# Patient Record
Sex: Male | Born: 1955 | Race: White | Hispanic: No | Marital: Married | Smoking: Never smoker
Health system: Southern US, Community
[De-identification: ages and names within clinical notes are randomized; demographics above are authoritative.]

## PROBLEM LIST (undated history)

## (undated) DIAGNOSIS — I10 Essential (primary) hypertension: Secondary | ICD-10-CM

## (undated) HISTORY — PX: NECK SURGERY: SHX720

## (undated) HISTORY — PX: HERNIA REPAIR: SHX51

## (undated) HISTORY — PX: TOTAL HIP ARTHROPLASTY: SHX124

---

## 1998-04-14 ENCOUNTER — Ambulatory Visit (HOSPITAL_COMMUNITY): Admission: RE | Admit: 1998-04-14 | Discharge: 1998-04-14 | Payer: Self-pay | Admitting: Neurological Surgery

## 2001-11-05 ENCOUNTER — Encounter: Payer: Self-pay | Admitting: Neurosurgery

## 2001-11-05 ENCOUNTER — Ambulatory Visit (HOSPITAL_COMMUNITY): Admission: RE | Admit: 2001-11-05 | Discharge: 2001-11-05 | Payer: Self-pay | Admitting: Neurosurgery

## 2002-04-24 ENCOUNTER — Encounter: Payer: Self-pay | Admitting: Neurosurgery

## 2002-04-30 ENCOUNTER — Encounter: Payer: Self-pay | Admitting: Neurosurgery

## 2002-04-30 ENCOUNTER — Ambulatory Visit (HOSPITAL_COMMUNITY): Admission: RE | Admit: 2002-04-30 | Discharge: 2002-04-30 | Payer: Self-pay | Admitting: Neurosurgery

## 2002-05-23 ENCOUNTER — Encounter: Payer: Self-pay | Admitting: Neurosurgery

## 2002-05-23 ENCOUNTER — Encounter: Admission: RE | Admit: 2002-05-23 | Discharge: 2002-05-23 | Payer: Self-pay | Admitting: Neurosurgery

## 2002-06-29 ENCOUNTER — Ambulatory Visit (HOSPITAL_COMMUNITY): Admission: RE | Admit: 2002-06-29 | Discharge: 2002-06-29 | Payer: Self-pay | Admitting: Neurosurgery

## 2002-06-29 ENCOUNTER — Encounter: Payer: Self-pay | Admitting: Neurosurgery

## 2003-10-03 ENCOUNTER — Encounter: Admission: RE | Admit: 2003-10-03 | Discharge: 2003-10-03 | Payer: Self-pay | Admitting: Neurosurgery

## 2003-10-22 ENCOUNTER — Ambulatory Visit: Admission: RE | Admit: 2003-10-22 | Discharge: 2003-10-22 | Payer: Self-pay | Admitting: Neurosurgery

## 2004-03-18 ENCOUNTER — Ambulatory Visit (HOSPITAL_COMMUNITY): Admission: RE | Admit: 2004-03-18 | Discharge: 2004-03-19 | Payer: Self-pay | Admitting: Neurosurgery

## 2004-04-05 ENCOUNTER — Encounter: Admission: RE | Admit: 2004-04-05 | Discharge: 2004-04-05 | Payer: Self-pay | Admitting: Neurosurgery

## 2014-04-28 ENCOUNTER — Encounter (HOSPITAL_COMMUNITY): Payer: Self-pay | Admitting: Emergency Medicine

## 2014-04-28 ENCOUNTER — Emergency Department (HOSPITAL_COMMUNITY)
Admission: EM | Admit: 2014-04-28 | Discharge: 2014-04-28 | Disposition: A | Discharge status: Home / Self Care | Payer: BC Managed Care – PPO | Attending: Emergency Medicine | Admitting: Emergency Medicine

## 2014-04-28 DIAGNOSIS — IMO0002 Reserved for concepts with insufficient information to code with codable children: Secondary | ICD-10-CM | POA: Insufficient documentation

## 2014-04-28 DIAGNOSIS — I10 Essential (primary) hypertension: Secondary | ICD-10-CM | POA: Insufficient documentation

## 2014-04-28 DIAGNOSIS — Y9389 Activity, other specified: Secondary | ICD-10-CM | POA: Insufficient documentation

## 2014-04-28 DIAGNOSIS — Y9289 Other specified places as the place of occurrence of the external cause: Secondary | ICD-10-CM | POA: Insufficient documentation

## 2014-04-28 DIAGNOSIS — T18108A Unspecified foreign body in esophagus causing other injury, initial encounter: Secondary | ICD-10-CM | POA: Insufficient documentation

## 2014-04-28 HISTORY — DX: Essential (primary) hypertension: I10

## 2014-04-28 MED ORDER — ONDANSETRON HCL 4 MG/2ML IJ SOLN
4.0000 mg | Freq: Once | INTRAMUSCULAR | Status: AC
  Administered 2014-04-28: 4 mg via INTRAVENOUS
  Filled 2014-04-28: qty 2

## 2014-04-28 MED ORDER — SODIUM CHLORIDE 0.9 % IV SOLN
Freq: Once | INTRAVENOUS | Status: AC
  Administered 2014-04-28: 500 mL via INTRAVENOUS

## 2014-04-28 MED ORDER — NITROGLYCERIN 0.4 MG SL SUBL
0.4000 mg | SUBLINGUAL_TABLET | SUBLINGUAL | Status: AC | PRN
  Administered 2014-04-28 (×3): 0.4 mg via SUBLINGUAL
  Filled 2014-04-28 (×3): qty 1

## 2014-04-28 MED ORDER — SODIUM CHLORIDE 0.9 % IV BOLUS (SEPSIS)
1000.0000 mL | Freq: Once | INTRAVENOUS | Status: AC
  Administered 2014-04-28: 1000 mL via INTRAVENOUS

## 2014-04-28 MED ORDER — MORPHINE SULFATE 4 MG/ML IJ SOLN
4.0000 mg | Freq: Once | INTRAMUSCULAR | Status: AC
  Administered 2014-04-28: 4 mg via INTRAVENOUS
  Filled 2014-04-28: qty 1

## 2014-04-28 MED ORDER — GLUCAGON HCL RDNA (DIAGNOSTIC) 1 MG IJ SOLR
1.0000 mg | Freq: Once | INTRAMUSCULAR | Status: AC
  Administered 2014-04-28: 1 mg via INTRAVENOUS
  Filled 2014-04-28: qty 1

## 2014-04-28 NOTE — ED Notes (Signed)
At some ham at approx 0100, feels food bolus stuck in esophagus.  Unable to swallow own saliva.  Able to burp, cannot swallow.  Has hx esophageal stricture and has esophagus stretched once prior to today.

## 2014-04-28 NOTE — ED Provider Notes (Signed)
CSN: [id number]     Arrival date & time 04/28/14  0233 History   First MD Initiated Contact with Patient 04/28/14 0258     Chief Complaint  Patient presents with  . Foreign Body     (Consider location/radiation/quality/duration/timing/severity/associated sxs/prior Treatment) Patient is a 58 y.o. male presenting with foreign body.  Foreign Body Location:  Swallowed Suspected object: ham. Pain quality:  Pressure Pain severity:  Mild Timing:  Constant Progression:  Unchanged Chronicity:  New Worsened by:  Nothing tried Ineffective treatments:  None tried Associated symptoms: trouble swallowing   Associated symptoms: no difficulty breathing, no drooling and no vomiting   Risk factors: hx of esophageal strictures   Risk factors: no developmental delay     Past Medical History  Diagnosis Date  . Hypertension    Past Surgical History  Procedure Laterality Date  . Hernia repair    . Total hip arthroplasty    . Neck surgery     No family history on file. History  Substance Use Topics  . Smoking status: Never Smoker   . Smokeless tobacco: Not on file  . Alcohol Use: Yes     Comment: ocassional    Review of Systems  HENT: Positive for trouble swallowing. Negative for drooling.   Gastrointestinal: Negative for vomiting.  All other systems reviewed and are negative.     Allergies  Review of patient's allergies indicates not on file.  Home Medications   Prior to Admission medications   Not on File   BP 124/73  Pulse 69  Temp(Src) 98.3 F (36.8 C) (Oral)  Resp 18  Ht 5\' 8"  (1.727 m)  Wt 175 lb (79.379 kg)  BMI 26.61 kg/m2  SpO2 99% Physical Exam  Constitutional: He is oriented to person, place, and time. He appears well-developed and well-nourished. No distress.  HENT:  Head: Normocephalic and atraumatic.  Mouth/Throat: Oropharynx is clear and moist.  Eyes: Conjunctivae are normal. Pupils are equal, round, and reactive to light.  Neck: Normal range of  motion. Neck supple.  Cardiovascular: Normal rate, regular rhythm and intact distal pulses.   Pulmonary/Chest: Effort normal and breath sounds normal. No respiratory distress. He has no wheezes. He has no rales.  Abdominal: Soft. Bowel sounds are normal. There is no tenderness. There is no rebound and no guarding.  Musculoskeletal: Normal range of motion.  Neurological: He is alert and oriented to person, place, and time.  Skin: Skin is warm and dry.  Psychiatric: He has a normal mood and affect.    ED Course  Procedures (including critical care time) Labs Review Labs Reviewed - No data to display  Imaging Review No results found.   EKG Interpretation None      MDM   Final diagnoses:  None    Date: 04/28/2014  Rate: 67  Rhythm: normal sinus rhythm  QRS Axis: normal  Intervals: normal  ST/T Wave abnormalities: normal  Conduction Disutrbances: none  Narrative Interpretation: unremarkable  Medications  sodium chloride 0.9 % bolus 1,000 mL (0 mLs Intravenous Stopped 04/28/14 0410)  ondansetron (ZOFRAN) injection 4 mg (4 mg Intravenous Given 04/28/14 0301)  nitroGLYCERIN (NITROSTAT) SL tablet 0.4 mg (0.4 mg Sublingual Given 04/28/14 0431)  glucagon (human recombinant) (GLUCAGEN) injection 1 mg (1 mg Intravenous Given 04/28/14 0318)  glucagon (human recombinant) (GLUCAGEN) injection 1 mg (1 mg Intravenous Given 04/28/14 0334)  0.9 %  sodium chloride infusion (500 mLs Intravenous New Bag/Given 04/28/14 0431)  morphine 4 MG/ML injection 4 mg (4 mg  Intravenous Given 04/28/14 0524)  ondansetron (ZOFRAN) injection 4 mg (4 mg Intravenous Given 04/28/14 0523)   MDM Reviewed: nursing note and vitals Interpretation: ECG Consults: gastrointestinal   CRITICAL CARE Performed by: Jasmine AwePALUMBO-RASCH,William Laske K Total critical care time: 60 minutes Critical care time was exclusive of separately billable procedures and treating other patients. Critical care was necessary to treat or prevent imminent or  life-threatening deterioration. Critical care was time spent personally by me on the following activities: development of treatment plan with patient and/or surrogate as well as nursing, discussions with consultants, evaluation of patient's response to treatment, examination of patient, obtaining history from patient or surrogate, ordering and performing treatments and interventions, ordering and review of laboratory studies, ordering and review of radiographic studies, pulse oximetry and re-evaluation of patient's condition.   420 case d/w Dr. Russella DarStark on call for Drs Loreta AveMann and Elnoria HowardHung to be endoscoped in am keep NPO  Montrey Buist K Ryanne Morand-Rasch, MD 04/28/14 [phone]

## 2014-04-28 NOTE — ED Notes (Signed)
Reports no change in status.  Bolus continues to block swallowing saliva.

## 2014-04-28 NOTE — Progress Notes (Signed)
Right antecubital iv removed,needlle intact, site within normal limits.Discharge instructions given to patient.Discharged ambulatory.

## 2014-04-28 NOTE — Consult Note (Signed)
**Note Alex-Identified via Obfuscation** Unassigned Consult  Reason for Consult: Food Impaction Referring Physician: ER  Alex Schmidt HPI: This is a 58 year old male with a distant history of esophageal stricture 10 years ago s/p dilation who presents to the ER with complaints of dysphagia.  The patient ate a piece of ham this morning and he started to have an acute episodes of dysphagia.  His symptoms continued to persist and he was not able to tolerate any PO.  On a routine basis he does have some issues with GERD and he states that he has a history of a hiatal hernia.  GI was consulted for an acute food impaction.  Past Medical History  Diagnosis Date  . Hypertension     Past Surgical History  Procedure Laterality Date  . Hernia repair    . Total hip arthroplasty    . Neck surgery      History reviewed. No pertinent family history.  Social History:  reports that he has never smoked. He does not have any smokeless tobacco history on file. He reports that he drinks alcohol. He reports that he does not use illicit drugs.  Allergies:  Allergies  Allergen Reactions  . Clarithromycin Other (See Comments)    diarrhea  . Erythromycin Diarrhea    Medications: Scheduled: Continuous:  No results found for this or any previous visit (from the past 24 hour(s)).   No results found.  ROS:  As stated above in the HPI otherwise negative.  Blood pressure 110/61, pulse 60, temperature 98.3 F (36.8 C), temperature source Oral, resp. rate 12, height 5\' 8"  (1.727 m), weight 175 lb (79.379 kg), SpO2 96.00%.    PE: Gen: NAD, Alert and Oriented HEENT:  [place]/AT, EOMI Neck: Supple, no LAD Lungs: CTA Bilaterally CV: RRR without M/G/R ABM: Soft, NTND, +BS Ext: No C/C/E  Assessment/Plan: 1) Food Impaction. 2) GERD.   Upon my arrival the patient reports that his food impaction spontaneously resolved.  He is no able to swallow without any difficulty.  I encouraged him to undergo an EGD as he will have a recurrence of his  symptoms, but he declines at this time.  He is agreeable to follow up in the office to pursue the procedure as an outpatient.  Plan: 1) Follow up in 2-4 weeks. 2) Chew food well. 3) PPI QD.  Bryceton Hantz D 04/28/2014, 7:53 AM

## 2014-04-28 NOTE — Progress Notes (Signed)
Received  From emergency room.Patient states "food has passed ",patient given a cup of water to drink water without difficulty.Dr Elnoria HowardHung evaluated patient and will make an appointment. Patient verbalize understanding of instructions.

## 2014-04-28 NOTE — ED Notes (Signed)
Pt st's he was eating a piece of ham and it is stuck in his throat.  Pt unable to swallow saliva.  No resp distress at this time.

## 2014-08-18 DIAGNOSIS — M79673 Pain in unspecified foot: Secondary | ICD-10-CM

## 2016-08-02 DIAGNOSIS — B351 Tinea unguium: Secondary | ICD-10-CM

## 2020-03-16 ENCOUNTER — Ambulatory Visit: Payer: Self-pay | Admitting: Podiatry

## 2020-04-06 ENCOUNTER — Ambulatory Visit (INDEPENDENT_AMBULATORY_CARE_PROVIDER_SITE_OTHER): Payer: Medicare Other | Admitting: Podiatry

## 2020-04-06 ENCOUNTER — Other Ambulatory Visit: Payer: Self-pay

## 2020-04-06 ENCOUNTER — Encounter: Payer: Self-pay | Admitting: Podiatry

## 2020-04-06 ENCOUNTER — Ambulatory Visit (INDEPENDENT_AMBULATORY_CARE_PROVIDER_SITE_OTHER): Payer: Medicare Other

## 2020-04-06 DIAGNOSIS — M6740 Ganglion, unspecified site: Secondary | ICD-10-CM | POA: Diagnosis not present

## 2020-04-06 DIAGNOSIS — M7989 Other specified soft tissue disorders: Secondary | ICD-10-CM | POA: Diagnosis not present

## 2020-04-06 DIAGNOSIS — M79671 Pain in right foot: Secondary | ICD-10-CM | POA: Diagnosis not present

## 2020-04-13 NOTE — Progress Notes (Signed)
**Note Alex-Identified via Obfuscation** Subjective:   Patient ID: Alex Schmidt, male   DOB: 64 y.o.   MRN: [id number]   HPI 64 year old male presents the office for concerns of a cyst on his right second toe.  He states it has been getting larger but no significant pain.  He is stuck dependent previously got thick, clear fluid.  He saw dermatology about 2 months ago.  He denies any recent injury.  No other concerns.   Review of Systems  All other systems reviewed and are negative.  Past Medical History:  Diagnosis Date  . Hypertension     Past Surgical History:  Procedure Laterality Date  . HERNIA REPAIR    . NECK SURGERY    . TOTAL HIP ARTHROPLASTY       Current Outpatient Medications:  .  metoprolol succinate (TOPROL-XL) 25 MG 24 hr tablet, Take by mouth., Disp: , Rfl:  .  irbesartan (AVAPRO) 300 MG tablet, Take 300 mg by mouth daily., Disp: , Rfl:  .  PARoxetine (PAXIL) 20 MG tablet, Take 20 mg by mouth daily., Disp: , Rfl:  .  Pitavastatin Calcium (LIVALO) 2 MG TABS, Take 1 tablet by mouth daily., Disp: , Rfl:   Allergies  Allergen Reactions  . Clarithromycin Other (See Comments)    diarrhea  . Erythromycin Diarrhea         Objective:  Physical Exam  General: AAO x3, NAD  Dermatological: At the distal aspect the right dorsal DIPJ is a fluid-filled soft tissue mass consistent with likely mucoid or ganglion cyst.  No edema, erythema surrounding the area.  No ascending cellulitis no fluctuation or crepitation.  Vascular: Dorsalis Pedis artery and Posterior Tibial artery pedal pulses are 2/4 bilateral with immedate capillary fill time. . There is no pain with calf compression, swelling, warmth, erythema.   Neruologic: Grossly intact via light touch bilateral.   Musculoskeletal: No gross boney pedal deformities bilateral. No pain, crepitus, or limitation noted with foot and ankle range of motion bilateral. Muscular strength 5/5 in all groups tested bilateral.  Gait: Unassisted, Nonantalgic.        Assessment:   Cyst right second toe     Plan:  -Treatment options discussed including all alternatives, risks, and complications -Etiology of symptoms were discussed -Discussed with aspiration, steroid injection.  He wished to proceed.  The skin was cleaned with alcohol and mixture of 3 cc of lidocaine, Marcaine plain was infiltrated in digital block fashion.  Once anesthetized the skin was prepped with Betadine.  An 18-gauge needle was utilized to aspirate clear, gel-like fluid.  Quarter cc dexamethasone phosphate was infiltrated into the area.  Compression bandage applied.  Postinjection care discussed.  Tolerated well any complications.  Discussed if recurrent surgical excision.  Return if symptoms worsen or fail to improve.  Vivi Barrack DPM

## 2020-06-12 ENCOUNTER — Other Ambulatory Visit: Payer: Self-pay | Admitting: Urology

## 2020-06-12 DIAGNOSIS — R972 Elevated prostate specific antigen [PSA]: Secondary | ICD-10-CM

## 2020-06-12 DIAGNOSIS — N138 Other obstructive and reflux uropathy: Secondary | ICD-10-CM

## 2020-07-15 ENCOUNTER — Other Ambulatory Visit: Payer: Self-pay | Admitting: Urology

## 2020-07-15 DIAGNOSIS — N401 Enlarged prostate with lower urinary tract symptoms: Secondary | ICD-10-CM

## 2020-07-15 DIAGNOSIS — N138 Other obstructive and reflux uropathy: Secondary | ICD-10-CM

## 2020-07-15 DIAGNOSIS — R972 Elevated prostate specific antigen [PSA]: Secondary | ICD-10-CM

## 2020-08-08 ENCOUNTER — Other Ambulatory Visit: Payer: Self-pay

## 2020-08-08 ENCOUNTER — Ambulatory Visit
Admission: RE | Admit: 2020-08-08 | Discharge: 2020-08-08 | Disposition: A | Discharge status: Home / Self Care | Payer: Medicare Other | Source: Ambulatory Visit | Attending: Urology | Admitting: Urology

## 2020-08-08 DIAGNOSIS — N138 Other obstructive and reflux uropathy: Secondary | ICD-10-CM

## 2020-08-08 DIAGNOSIS — R972 Elevated prostate specific antigen [PSA]: Secondary | ICD-10-CM

## 2020-08-14 ENCOUNTER — Other Ambulatory Visit (HOSPITAL_COMMUNITY): Payer: Self-pay | Admitting: Urology

## 2020-08-14 DIAGNOSIS — N138 Other obstructive and reflux uropathy: Secondary | ICD-10-CM

## 2020-08-14 DIAGNOSIS — R972 Elevated prostate specific antigen [PSA]: Secondary | ICD-10-CM

## 2020-08-14 DIAGNOSIS — N401 Enlarged prostate with lower urinary tract symptoms: Secondary | ICD-10-CM

## 2020-08-17 ENCOUNTER — Other Ambulatory Visit (HOSPITAL_COMMUNITY): Payer: Self-pay | Admitting: Urology

## 2020-08-17 DIAGNOSIS — N138 Other obstructive and reflux uropathy: Secondary | ICD-10-CM

## 2020-08-17 DIAGNOSIS — R972 Elevated prostate specific antigen [PSA]: Secondary | ICD-10-CM

## 2020-09-01 ENCOUNTER — Other Ambulatory Visit: Payer: Self-pay

## 2020-09-01 ENCOUNTER — Ambulatory Visit (HOSPITAL_COMMUNITY)
Admission: RE | Admit: 2020-09-01 | Discharge: 2020-09-01 | Disposition: A | Discharge status: Home / Self Care | Payer: Medicare Other | Source: Ambulatory Visit | Attending: Urology | Admitting: Urology

## 2020-09-01 DIAGNOSIS — N401 Enlarged prostate with lower urinary tract symptoms: Secondary | ICD-10-CM | POA: Insufficient documentation

## 2020-09-01 DIAGNOSIS — N138 Other obstructive and reflux uropathy: Secondary | ICD-10-CM | POA: Insufficient documentation

## 2020-09-01 DIAGNOSIS — R972 Elevated prostate specific antigen [PSA]: Secondary | ICD-10-CM | POA: Diagnosis present

## 2020-09-01 MED ORDER — GADOBUTROL 1 MMOL/ML IV SOLN
8.0000 mL | Freq: Once | INTRAVENOUS | Status: AC | PRN
  Administered 2020-09-01: 8 mL via INTRAVENOUS

## 2022-01-21 IMAGING — MR MR PROSTATE WO/W CM
12 series · 48 of 48 positions shown · IV contrast (gadavist)
Comparison: CT pelvis 05/11/2020

CLINICAL DATA: Elevated PSA level

EXAM:
MR PROSTATE WITHOUT AND WITH CONTRAST
TECHNIQUE: Multiplanar multisequence MRI images were obtained of the pelvis
centered about the prostate. Pre and post contrast images were
obtained.
CONTRAST:  8mL GADAVIST GADOBUTROL 1 MMOL/ML IV SOLN

[Series 3: T1 · axial · 6.0mm · 0.86mm/px · 1 of 39 slices shown (1 of 2)]
[im 1/39]
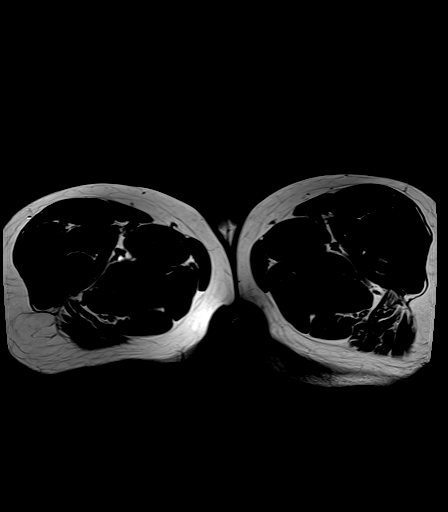

[Series 4: axial whole pelvis · axial · 6.0mm · 0.84mm/px · 1 of 39 slices shown]
[im 1/39]
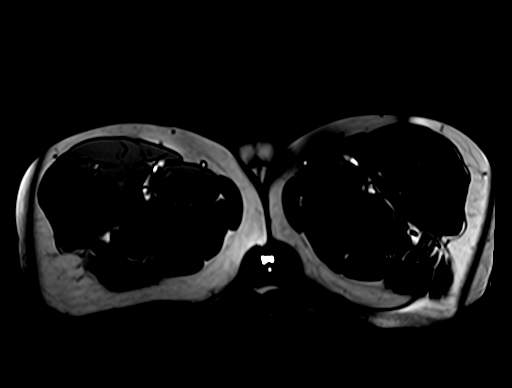

[Series 5: T2 · sagittal · 3.5mm · 0.47mm/px · 1 of 26 slices shown (1 of 4)]
[im 1/26]
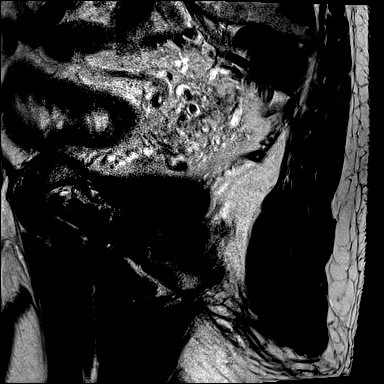

[Series 6: T2 · coronal · 4.0mm · 0.47mm/px · 1 of 24 slices shown (2 of 4)]
[im 1/24]
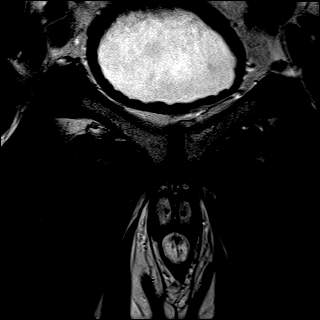

[Series 7: T2 · axial · 3.0mm · 0.47mm/px · 1 of 28 slices shown (3 of 4)]
[im 1/28]
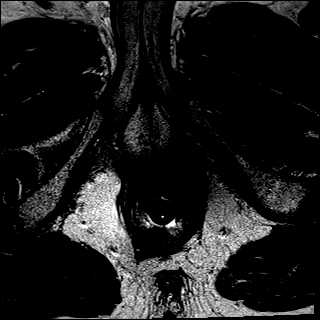

[Series 8: T1 · axial · 3.0mm · 0.50mm/px · 1 of 28 slices shown (2 of 2)]
[im 1/28]
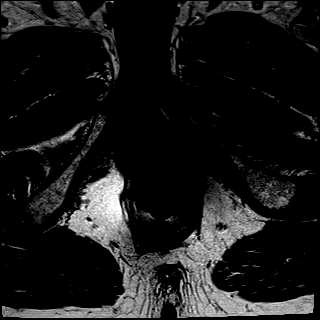

[Series 9: T2 · axial · 1.2mm · 1.00mm/px · z∈[-52,+33]mm · 2 of 72 slices shown (4 of 4)]
[im 1/72]
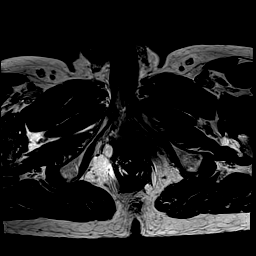
[im 72/72]
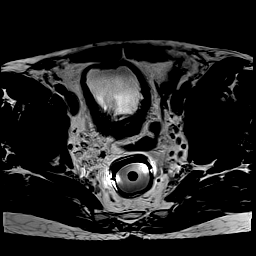

[Series 10: ep2d_diff_b50_400_800_tra_endo**_tracew_dfc_mix · axial · 3.0mm · 1.60mm/px · z∈[-58,+27]mm · 2 of 75 slices shown]
[im 1/75]
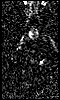
[im 75/75]
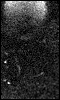

[Series 11: ep2d_diff_b50_400_800_tra_endo**_adc_dfc_mix · axial · 3.0mm · 1.60mm/px · 1 of 24 slices shown]
[im 1/24]
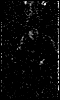

[Series 12: ep2d_diff_b50_400_800_tra_endo**_calc_bval_dfc_mix · axial · 3.0mm · 1.60mm/px · 1 of 25 slices shown]
[im 1/25]
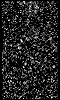

[Series 13: t1_vibe_tra_dyn · axial · 3.0mm · 0.98mm/px · z∈[-54,+33]mm · 19 of 600 slices shown]
[im 1/600]
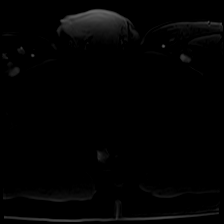
[im 34/600]
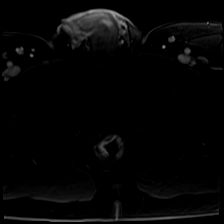
[im 67/600]
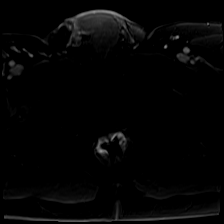
[im 100/600]
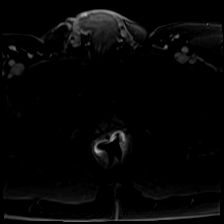
[im 134/600]
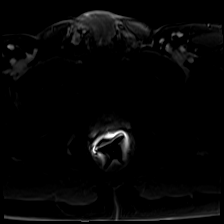
[im 167/600]
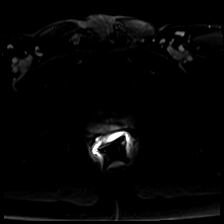
[im 200/600]
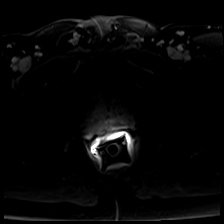
[im 233/600]
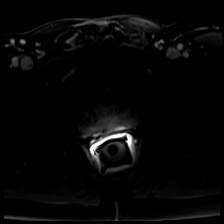
[im 267/600]
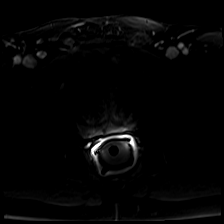
[im 300/600]
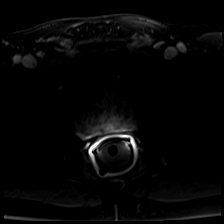
[im 333/600]
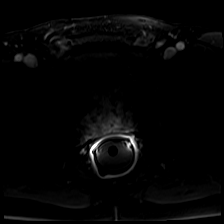
[im 367/600]
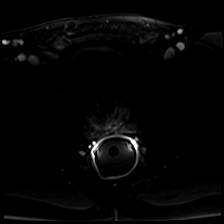
[im 400/600]
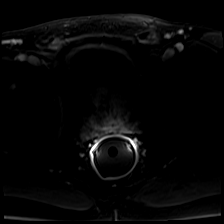
[im 433/600]
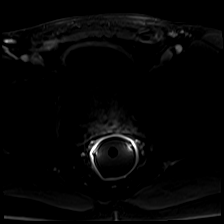
[im 466/600]
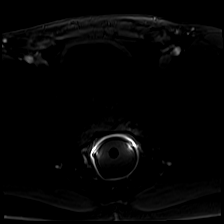
[im 500/600]
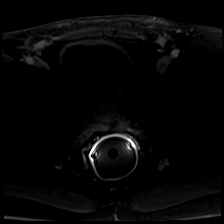
[im 533/600]
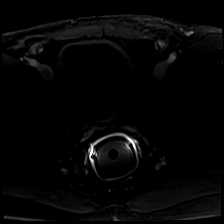
[im 566/600]
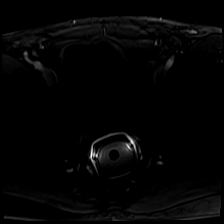
[im 600/600]
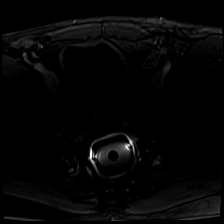

[Series 14: t1_vibe_tra_dyn_sub · axial · 3.0mm · 0.98mm/px · z∈[-54,+33]mm · 17 of 553 slices shown]
[im 1/553]
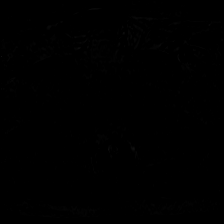
[im 35/553]
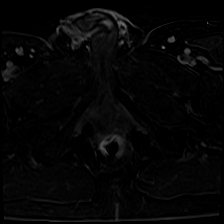
[im 70/553]
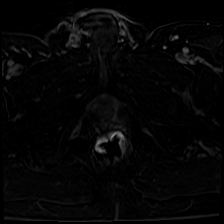
[im 104/553]
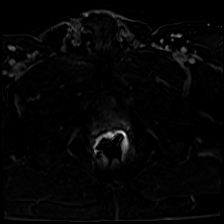
[im 139/553]
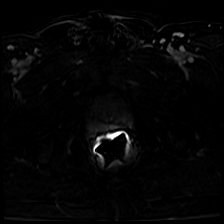
[im 173/553]
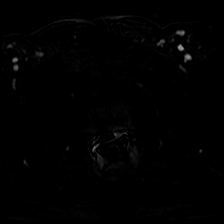
[im 208/553]
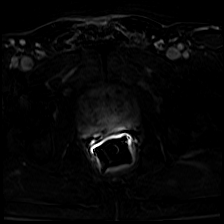
[im 242/553]
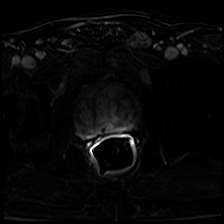
[im 277/553]
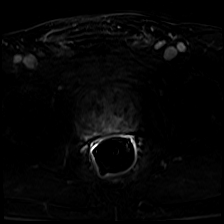
[im 311/553]
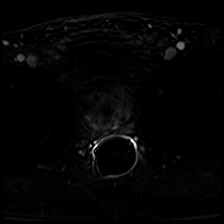
[im 346/553]
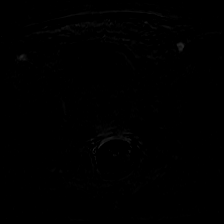
[im 380/553]
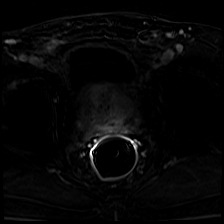
[im 415/553]
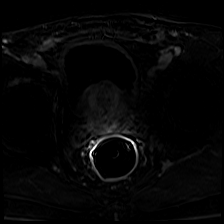
[im 449/553]
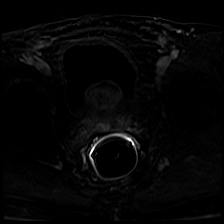
[im 484/553]
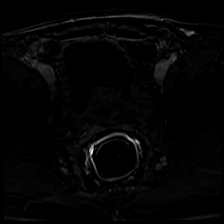
[im 518/553]
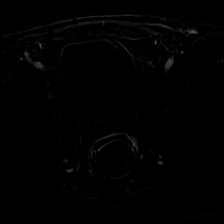
[im 553/553]
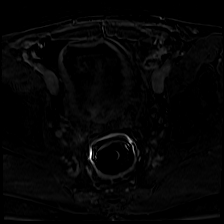

[48 of 48 positions shown; findings below may reference images not displayed]

FINDINGS: Imaging was performed using rectal coil. The patient has bilateral
hip prostheses which cause artifact which interfere with some
series, especially the diffusion-weighted series. This can reduce
overall exam sensitivity and specificity.

Prostate: Hazy low T2 signal stranding in the peripheral zone
bilaterally with generalized early enhancement, probably
postinflammatory in considered PI-RADS category 2.

Region of interest # 1: PI-RADS category 3 lesion of the left
posterolateral and posteromedial peripheral zone in the apex, and
left posteromedial peripheral zone in the mid gland, with
ill-defined but more focally reduced T2 signal but no differential
early enhancement. This region measures 2.84 cubic cm (2.1 by 0.4 by
2.4 cm) and is shown for example on image 53 of series 9.

Encapsulated nodularity in the transition zones compatible with
benign prostatic hypertrophy. This indents the bladder base.

Volume: 3D volumetric analysis: Prostate volume 122.08 cubic cm (6.7
by 5.3 by 7.1 cm). The

Transcapsular spread:  Absent

Seminal vesicle involvement: Absent

Neurovascular bundle involvement: Absent

Pelvic adenopathy: Absent

Bone metastasis: Absent

Other findings: Bilateral metal hip prostheses.
IMPRESSION: 1. PI-RADS category 3 lesion of the left posterolateral and
posteromedial peripheral zone in the apex, and left posteromedial
peripheral zone in the mid gland. Targeting data sent to [facility].
2. No evidence of extracapsular extension, seminal vesicle
involvement, or metastatic disease.
3. Hazy low T2 signal stranding in the peripheral zone bilaterally
with generalized early enhancement, probably postinflammatory in
considered PI-RADS category 2.
4. Benign prostatic hypertrophy, prostate volume 122.08 cubic cm.

## 2022-11-07 ENCOUNTER — Ambulatory Visit (INDEPENDENT_AMBULATORY_CARE_PROVIDER_SITE_OTHER): Payer: Medicare Other | Admitting: Podiatry

## 2022-11-07 ENCOUNTER — Ambulatory Visit (INDEPENDENT_AMBULATORY_CARE_PROVIDER_SITE_OTHER): Payer: Medicare Other

## 2022-11-07 DIAGNOSIS — M21619 Bunion of unspecified foot: Secondary | ICD-10-CM

## 2022-11-07 DIAGNOSIS — M7751 Other enthesopathy of right foot: Secondary | ICD-10-CM | POA: Diagnosis not present

## 2022-11-07 DIAGNOSIS — M21611 Bunion of right foot: Secondary | ICD-10-CM | POA: Diagnosis not present

## 2022-11-07 NOTE — Progress Notes (Signed)
Subjective: Chief Complaint  Patient presents with   Bunions    Right foot    67 year old male presents the office with above concerns.  He points to the 1st MTPJ. It started about a year ago but it has been getting worse. He he tried biofreeze on it that helps. Certain shoes aggravate it.  He no recent injuries that he reports.  Numbness or tingling.  Previous was having left foot pain was seen by orthopedics for this.  He had x-rays performed on this side.   Objective: AAO x3, NAD DP/PT pulses palpable bilaterally, CRT less than 3 seconds On the right foot moderate bunions present.  There is no edema, erythema.  No crepitation with range of motion slight restriction however in the end range of motion.  No hypermobility of the first ray. There is no other areas of pinpoint tenderness noted bilaterally.  No rebound erythema.  MMT 5/5. No pain with calf compression, swelling, warmth, erythema  Assessment: Bunion, arthritis right first MPJ  Plan: -All treatment options discussed with the patient including all alternatives, risks, complications.  -X-rays were obtained and reviewed.  3 views of the foot were obtained.  Bunion is present and there is arthritic is present at the first MPJ.  No evidence of acute fracture. -Discussed both conservative as well as surgical treatment options.  We discussed conservative care including shoe modifications, offloading pads which were dispensed today as well as medication such as Voltaren gel or continuing with Biofreeze.  Injection if needed.  Discussed surgical intervention if needed.  We discussed also bunionectomy versus 1st MTPJ arthrodesis. -Patient encouraged to call the office with any questions, concerns, change in symptoms.   Trula Slade DPM

## 2022-11-07 NOTE — Patient Instructions (Signed)
For inserts I like [employer], [place], [place].   ---  Bunion A bunion (hallux valgus) is a bump that forms slowly on the inner side of the big toe joint. It occurs when the big toe turns toward the second toe. Bunions may be small at first, but they often get larger over time. They can make walking painful. What are the causes? This condition may be caused by: Wearing narrow or pointed shoes that force the big toe to press against the other toes. Abnormal foot development that causes the foot to roll inward. Changes in the foot that are caused by certain diseases, such as rheumatoid arthritis or polio. A foot injury. What increases the risk? The following factors may make you more likely to develop this condition: Wearing shoes that squeeze the toes together. Having certain diseases, such as: Rheumatoid arthritis. Polio. Cerebral palsy. Having family members who have bunions. Being born with abnormally shaped feet (a foot deformity), such as flat feet or low arches. Doing activities that put a lot of pressure on the feet, such as ballet dancing. What are the signs or symptoms?  The main symptom of this condition is a bump on your big toe that you can notice. Other symptoms may include: Pain. Redness and inflammation around your big toe. Thick or hardened skin on your big toe or between your toes. Stiffness or loss of motion in your big toe. Trouble with walking. How is this diagnosed? This condition may be diagnosed based on your symptoms, medical history, and activities. You may also have tests and imaging, such as: X-rays. These allow your health care provider to check the position of the bones in your foot and look for damage to your joint. They also help your health care provider determine the severity of your bunion and the best way to treat it. Joint aspiration. In this test, a sample of fluid is removed from the toe joint. This test may be done if you are in a lot of pain.  It helps rule out diseases that cause painful swelling of the joints, such as arthritis or gout. How is this treated? Treatment depends on the severity of your symptoms. The goal of treatment is to relieve symptoms and prevent your bunion from getting worse. Your health care provider may recommend: Wearing shoes that have a wide toe box, or using bunion pads to cushion the affected area. Taping your toes together to keep them in a normal position. Placing a device inside your shoe (orthotic device) to help reduce pressure on your toe joint. Taking medicine to ease pain and inflammation. Putting ice or heat on the affected area. Doing stretching exercises. Surgery, for severe cases. Follow these instructions at home: Managing pain, stiffness, and swelling     If directed, put ice on the painful area. To do this: Put ice in a plastic bag. Place a towel between your skin and the bag. Leave the ice on for 20 minutes, 2-3 times a day. Remove the ice if your skin turns bright red. This is very important. If you cannot feel pain, heat, or cold, you have a greater risk of damage to the area. If directed, apply heat to the affected area before you exercise. Use the heat source that your health care provider recommends, such as a moist heat pack or a heating pad. Place a towel between your skin and the heat source. Leave the heat on for 20-30 minutes. Remove the heat if your skin turns bright red. This  is especially important if you are unable to feel pain, heat, or cold. You have a greater risk of getting burned. General instructions Do exercises as told by your health care provider. Support your toe joint with proper footwear, shoe padding, or taping as told by your health care provider. Take over-the-counter and prescription medicines only as told by your health care provider. Do not use any products that contain nicotine or tobacco, such as cigarettes, e-cigarettes, and chewing tobacco. If you  need help quitting, ask your health care provider. Keep all follow-up visits. This is important. Contact a health care provider if: Your symptoms get worse. Your symptoms do not improve in 2 weeks. Get help right away if: You have severe pain and trouble with walking. Summary A bunion is a bump on the inner side of the big toe joint that forms when the big toe turns toward the second toe. Bunions can make walking painful. Treatment depends on the severity of your symptoms. Support your toe joint with proper footwear, shoe padding, or taping as told by your health care provider. This information is not intended to replace advice given to you by your health care provider. Make sure you discuss any questions you have with your health care provider. Document Revised: 01/17/2020 Document Reviewed: 01/17/2020 [employer] Patient Education  [place].
